# Patient Record
Sex: Female | Born: 1970 | Race: White | Hispanic: No | Marital: Single | Smoking: Never smoker
Health system: Southern US, Community
[De-identification: ages and names within clinical notes are randomized; demographics above are authoritative.]

## PROBLEM LIST (undated history)

## (undated) DIAGNOSIS — F329 Major depressive disorder, single episode, unspecified: Secondary | ICD-10-CM

## (undated) DIAGNOSIS — F32A Depression, unspecified: Secondary | ICD-10-CM

## (undated) DIAGNOSIS — F419 Anxiety disorder, unspecified: Secondary | ICD-10-CM

## (undated) HISTORY — PX: TUBAL LIGATION: SHX77

---

## 1997-10-07 ENCOUNTER — Ambulatory Visit (HOSPITAL_BASED_OUTPATIENT_CLINIC_OR_DEPARTMENT_OTHER): Admission: RE | Admit: 1997-10-07 | Discharge: 1997-10-07 | Payer: Self-pay | Admitting: Plastic Surgery

## 1998-12-10 ENCOUNTER — Other Ambulatory Visit: Admission: RE | Admit: 1998-12-10 | Discharge: 1998-12-10 | Payer: Self-pay | Admitting: *Deleted

## 2000-01-19 ENCOUNTER — Other Ambulatory Visit: Admission: RE | Admit: 2000-01-19 | Discharge: 2000-01-19 | Payer: Self-pay | Admitting: *Deleted

## 2001-02-14 ENCOUNTER — Other Ambulatory Visit: Admission: RE | Admit: 2001-02-14 | Discharge: 2001-02-14 | Payer: Self-pay | Admitting: *Deleted

## 2002-04-03 ENCOUNTER — Other Ambulatory Visit: Admission: RE | Admit: 2002-04-03 | Discharge: 2002-04-03 | Payer: Self-pay | Admitting: Obstetrics and Gynecology

## 2003-04-17 ENCOUNTER — Other Ambulatory Visit: Admission: RE | Admit: 2003-04-17 | Discharge: 2003-04-17 | Payer: Self-pay | Admitting: *Deleted

## 2003-04-17 ENCOUNTER — Other Ambulatory Visit: Admission: RE | Admit: 2003-04-17 | Discharge: 2003-04-17 | Payer: Self-pay | Admitting: Obstetrics and Gynecology

## 2004-05-06 ENCOUNTER — Other Ambulatory Visit: Admission: RE | Admit: 2004-05-06 | Discharge: 2004-05-06 | Payer: Self-pay | Admitting: Obstetrics and Gynecology

## 2005-04-26 ENCOUNTER — Inpatient Hospital Stay (HOSPITAL_COMMUNITY): Admission: AD | Admit: 2005-04-26 | Discharge: 2005-04-26 | Payer: Self-pay | Admitting: *Deleted

## 2005-05-27 ENCOUNTER — Other Ambulatory Visit: Admission: RE | Admit: 2005-05-27 | Discharge: 2005-05-27 | Payer: Self-pay | Admitting: Obstetrics and Gynecology

## 2005-05-27 ENCOUNTER — Ambulatory Visit (HOSPITAL_COMMUNITY): Admission: RE | Admit: 2005-05-27 | Discharge: 2005-05-27 | Payer: Self-pay | Admitting: Obstetrics and Gynecology

## 2006-02-09 ENCOUNTER — Inpatient Hospital Stay (HOSPITAL_COMMUNITY): Admission: AD | Admit: 2006-02-09 | Discharge: 2006-02-09 | Payer: Self-pay | Admitting: Obstetrics and Gynecology

## 2006-09-22 ENCOUNTER — Inpatient Hospital Stay (HOSPITAL_COMMUNITY): Admission: RE | Admit: 2006-09-22 | Discharge: 2006-09-25 | Payer: Self-pay | Admitting: Obstetrics and Gynecology

## 2011-02-15 LAB — HIV ANTIBODY (ROUTINE TESTING W REFLEX): HIV: NONREACTIVE

## 2011-02-15 LAB — HEPATITIS B SURFACE ANTIGEN: Hepatitis B Surface Ag: NEGATIVE

## 2011-02-15 LAB — RPR: RPR: NONREACTIVE

## 2011-02-15 LAB — RUBELLA ANTIBODY, IGM: Rubella: IMMUNE

## 2011-02-15 LAB — ANTIBODY SCREEN: Antibody Screen: NEGATIVE

## 2011-02-15 LAB — ABO/RH

## 2011-08-17 MED ORDER — FENTANYL CITRATE 0.05 MG/ML IJ SOLN
INTRAMUSCULAR | Status: AC
  Filled 2011-08-17: qty 5

## 2011-08-17 MED ORDER — MIDAZOLAM HCL 2 MG/2ML IJ SOLN
INTRAMUSCULAR | Status: AC
  Filled 2011-08-17: qty 2

## 2011-08-29 ENCOUNTER — Encounter (HOSPITAL_COMMUNITY): Payer: Self-pay | Admitting: Pharmacist

## 2011-09-05 ENCOUNTER — Encounter (HOSPITAL_COMMUNITY): Payer: Self-pay

## 2011-09-06 ENCOUNTER — Encounter (HOSPITAL_COMMUNITY)
Admission: RE | Admit: 2011-09-06 | Discharge: 2011-09-06 | Disposition: A | Discharge status: Home / Self Care | Payer: 59 | Source: Ambulatory Visit | Attending: Obstetrics and Gynecology | Admitting: Obstetrics and Gynecology

## 2011-09-06 ENCOUNTER — Encounter (HOSPITAL_COMMUNITY): Payer: Self-pay

## 2011-09-06 HISTORY — DX: Major depressive disorder, single episode, unspecified: F32.9

## 2011-09-06 HISTORY — DX: Depression, unspecified: F32.A

## 2011-09-06 HISTORY — DX: Anxiety disorder, unspecified: F41.9

## 2011-09-06 LAB — CBC
HCT: 39 % (ref 36.0–46.0)
Hemoglobin: 13.4 g/dL (ref 12.0–15.0)
MCH: 30.9 pg (ref 26.0–34.0)
MCHC: 34.4 g/dL (ref 30.0–36.0)
MCV: 90.1 fL (ref 78.0–100.0)
Platelets: 191 10*3/uL (ref 150–400)
RBC: 4.33 MIL/uL (ref 3.87–5.11)
RDW: 13.2 % (ref 11.5–15.5)
WBC: 8.8 10*3/uL (ref 4.0–10.5)

## 2011-09-06 LAB — RPR: RPR Ser Ql: NONREACTIVE

## 2011-09-06 LAB — SURGICAL PCR SCREEN
MRSA, PCR: NEGATIVE
Staphylococcus aureus: POSITIVE — AB

## 2011-09-06 NOTE — Patient Instructions (Signed)
YOUR PROCEDURE IS SCHEDULED ON:09/16/11  ENTER THROUGH THE MAIN ENTRANCE OF [facility] AT:6am  USE DESK PHONE AND DIAL [id number] TO INFORM us OF YOUR ARRIVAL  CALL [phone] IF YOU HAVE ANY QUESTIONS OR PROBLEMS PRIOR TO YOUR ARRIVAL.  REMEMBER: DO NOT EAT OR DRINK AFTER MIDNIGHT :Thursday  SPECIAL INSTRUCTIONS:   YOU MAY BRUSH YOUR TEETH THE MORNING OF SURGERY   TAKE THESE MEDICINES THE DAY OF SURGERY WITH SIP OF WATER:none  DO NOT WEAR JEWELRY, EYE MAKEUP, LIPSTICK OR DARK FINGERNAIL POLISH DO NOT WEAR LOTIONS  DO NOT SHAVE FOR 48 HOURS PRIOR TO SURGERY  YOU WILL NOT BE ALLOWED TO DRIVE YOURSELF HOME.  NAME OF DRIVER:Conan

## 2011-09-15 MED ORDER — DEXTROSE 5 % IV SOLN
2.0000 g | INTRAVENOUS | Status: AC
  Administered 2011-09-16: 2 g via INTRAVENOUS
  Filled 2011-09-15: qty 2

## 2011-09-15 NOTE — H&P (Addendum)
41 yo G3P1 @ 30 weeks presents for repeat c-section.  Pregnancy uncomplicated.  Past History - See hollister Latex allergy  AF, VSS Gen - NAD CV - RRR Lungs - Clear b Abd - gravid, NT Ext - no clonus  A/P:  Repeat c-section.  Questions answered and informed consent. Plan of care discussed again with pt.  She and husband desire sterility.  Will perform BPS.  Risks and alternatives discussed.  Informed consent

## 2011-09-16 ENCOUNTER — Encounter (HOSPITAL_COMMUNITY): Payer: Self-pay | Admitting: *Deleted

## 2011-09-16 ENCOUNTER — Encounter (HOSPITAL_COMMUNITY): Payer: Self-pay | Admitting: Neonatology

## 2011-09-16 ENCOUNTER — Encounter (HOSPITAL_COMMUNITY): Payer: Self-pay | Admitting: Anesthesiology

## 2011-09-16 ENCOUNTER — Inpatient Hospital Stay (HOSPITAL_COMMUNITY)
Admission: RE | Admit: 2011-09-16 | Discharge: 2011-09-19 | DRG: 765 | Disposition: A | Discharge status: Home / Self Care | Payer: 59 | Source: Ambulatory Visit | Attending: Obstetrics and Gynecology | Admitting: Obstetrics and Gynecology

## 2011-09-16 ENCOUNTER — Encounter (HOSPITAL_COMMUNITY)
Admission: RE | Disposition: A | Discharge status: Home / Self Care | Payer: Self-pay | Source: Ambulatory Visit | Attending: Obstetrics and Gynecology

## 2011-09-16 ENCOUNTER — Inpatient Hospital Stay (HOSPITAL_COMMUNITY): Payer: 59 | Admitting: Anesthesiology

## 2011-09-16 DIAGNOSIS — L03319 Cellulitis of trunk, unspecified: Secondary | ICD-10-CM | POA: Diagnosis not present

## 2011-09-16 DIAGNOSIS — O909 Complication of the puerperium, unspecified: Secondary | ICD-10-CM | POA: Diagnosis not present

## 2011-09-16 DIAGNOSIS — Z01812 Encounter for preprocedural laboratory examination: Secondary | ICD-10-CM

## 2011-09-16 DIAGNOSIS — Z01818 Encounter for other preprocedural examination: Secondary | ICD-10-CM

## 2011-09-16 DIAGNOSIS — Z302 Encounter for sterilization: Secondary | ICD-10-CM

## 2011-09-16 DIAGNOSIS — O34219 Maternal care for unspecified type scar from previous cesarean delivery: Principal | ICD-10-CM | POA: Diagnosis present

## 2011-09-16 DIAGNOSIS — L02219 Cutaneous abscess of trunk, unspecified: Secondary | ICD-10-CM | POA: Diagnosis not present

## 2011-09-16 LAB — CBC
HCT: 36.3 % (ref 36.0–46.0)
Hemoglobin: 12.1 g/dL (ref 12.0–15.0)
MCHC: 33.3 g/dL (ref 30.0–36.0)
MCV: 89.6 fL (ref 78.0–100.0)

## 2011-09-16 SURGERY — Surgical Case
Anesthesia: Spinal | Site: Abdomen | Laterality: Bilateral | Wound class: Clean Contaminated

## 2011-09-16 MED ORDER — OXYCODONE-ACETAMINOPHEN 5-325 MG PO TABS
1.0000 | ORAL_TABLET | ORAL | Status: DC | PRN
  Administered 2011-09-16 (×2): 1 via ORAL
  Administered 2011-09-17 (×5): 2 via ORAL
  Administered 2011-09-17 – 2011-09-19 (×8): 1 via ORAL
  Administered 2011-09-19: 2 via ORAL
  Filled 2011-09-16: qty 2
  Filled 2011-09-16 (×2): qty 1
  Filled 2011-09-16: qty 2
  Filled 2011-09-16: qty 1
  Filled 2011-09-16: qty 2
  Filled 2011-09-16: qty 1
  Filled 2011-09-16 (×2): qty 2
  Filled 2011-09-16 (×7): qty 1
  Filled 2011-09-16: qty 2

## 2011-09-16 MED ORDER — SODIUM CHLORIDE 0.9 % IV SOLN
1.0000 ug/kg/h | INTRAVENOUS | Status: DC | PRN
  Filled 2011-09-16: qty 2.5

## 2011-09-16 MED ORDER — DIPHENHYDRAMINE HCL 50 MG/ML IJ SOLN: 12.5000 mg | INTRAMUSCULAR | Status: DC | PRN

## 2011-09-16 MED ORDER — HYDROMORPHONE HCL PF 1 MG/ML IJ SOLN
0.5000 mg | INTRAMUSCULAR | Status: AC
  Administered 2011-09-16 (×2): 0.5 mg via INTRAVENOUS
  Filled 2011-09-16 (×3): qty 1

## 2011-09-16 MED ORDER — MEDROXYPROGESTERONE ACETATE 150 MG/ML IM SUSP: 150.0000 mg | INTRAMUSCULAR | Status: DC | PRN

## 2011-09-16 MED ORDER — FENTANYL CITRATE 0.05 MG/ML IJ SOLN
INTRAMUSCULAR | Status: AC
  Filled 2011-09-16: qty 2

## 2011-09-16 MED ORDER — MENTHOL 3 MG MT LOZG: 1.0000 | LOZENGE | OROMUCOSAL | Status: DC | PRN

## 2011-09-16 MED ORDER — IBUPROFEN 600 MG PO TABS
600.0000 mg | ORAL_TABLET | Freq: Four times a day (QID) | ORAL | Status: DC
  Administered 2011-09-16 – 2011-09-19 (×11): 600 mg via ORAL
  Filled 2011-09-16 (×11): qty 1

## 2011-09-16 MED ORDER — IBUPROFEN 600 MG PO TABS: 600.0000 mg | ORAL_TABLET | Freq: Four times a day (QID) | ORAL | Status: DC | PRN

## 2011-09-16 MED ORDER — OXYTOCIN 20 UNITS IN LACTATED RINGERS INFUSION - SIMPLE: 125.0000 mL/h | INTRAVENOUS | Status: AC

## 2011-09-16 MED ORDER — KETOROLAC TROMETHAMINE 30 MG/ML IJ SOLN
30.0000 mg | Freq: Four times a day (QID) | INTRAMUSCULAR | Status: AC | PRN
  Administered 2011-09-16: 30 mg via INTRAMUSCULAR

## 2011-09-16 MED ORDER — ONDANSETRON HCL 4 MG/2ML IJ SOLN: 4.0000 mg | Freq: Three times a day (TID) | INTRAMUSCULAR | Status: DC | PRN

## 2011-09-16 MED ORDER — ONDANSETRON HCL 4 MG PO TABS: 4.0000 mg | ORAL_TABLET | ORAL | Status: DC | PRN

## 2011-09-16 MED ORDER — LACTATED RINGERS IV SOLN
INTRAVENOUS | Status: DC
  Administered 2011-09-16 (×4): via INTRAVENOUS

## 2011-09-16 MED ORDER — DIPHENHYDRAMINE HCL 25 MG PO CAPS: 25.0000 mg | ORAL_CAPSULE | Freq: Four times a day (QID) | ORAL | Status: DC | PRN

## 2011-09-16 MED ORDER — OXYTOCIN 20 UNITS IN LACTATED RINGERS INFUSION - SIMPLE
INTRAVENOUS | Status: DC | PRN
  Administered 2011-09-16 (×2): 20 [IU] via INTRAVENOUS

## 2011-09-16 MED ORDER — FENTANYL CITRATE 0.05 MG/ML IJ SOLN: 25.0000 ug | INTRAMUSCULAR | Status: DC | PRN

## 2011-09-16 MED ORDER — FENTANYL CITRATE 0.05 MG/ML IJ SOLN
INTRAMUSCULAR | Status: DC | PRN
  Administered 2011-09-16: 15 ug via INTRATHECAL

## 2011-09-16 MED ORDER — KETOROLAC TROMETHAMINE 30 MG/ML IJ SOLN
30.0000 mg | Freq: Four times a day (QID) | INTRAMUSCULAR | Status: AC | PRN
  Administered 2011-09-16: 30 mg via INTRAVENOUS
  Filled 2011-09-16: qty 1

## 2011-09-16 MED ORDER — DIPHENHYDRAMINE HCL 50 MG/ML IJ SOLN
25.0000 mg | INTRAMUSCULAR | Status: DC | PRN
Start: 2011-09-16 — End: 2011-09-19

## 2011-09-16 MED ORDER — MEPERIDINE HCL 25 MG/ML IJ SOLN: 6.2500 mg | INTRAMUSCULAR | Status: DC | PRN

## 2011-09-16 MED ORDER — DIBUCAINE 1 % RE OINT: 1.0000 "application " | TOPICAL_OINTMENT | RECTAL | Status: DC | PRN

## 2011-09-16 MED ORDER — NALBUPHINE HCL 10 MG/ML IJ SOLN
5.0000 mg | INTRAMUSCULAR | Status: DC | PRN
  Filled 2011-09-16: qty 1

## 2011-09-16 MED ORDER — MORPHINE SULFATE 0.5 MG/ML IJ SOLN
INTRAMUSCULAR | Status: AC
  Filled 2011-09-16: qty 10

## 2011-09-16 MED ORDER — ONDANSETRON HCL 4 MG/2ML IJ SOLN
INTRAMUSCULAR | Status: DC | PRN
  Administered 2011-09-16: 4 mg via INTRAVENOUS

## 2011-09-16 MED ORDER — WITCH HAZEL-GLYCERIN EX PADS: 1.0000 "application " | MEDICATED_PAD | CUTANEOUS | Status: DC | PRN

## 2011-09-16 MED ORDER — LANOLIN HYDROUS EX OINT: 1.0000 "application " | TOPICAL_OINTMENT | CUTANEOUS | Status: DC | PRN

## 2011-09-16 MED ORDER — SENNOSIDES-DOCUSATE SODIUM 8.6-50 MG PO TABS
2.0000 | ORAL_TABLET | Freq: Every day | ORAL | Status: DC
  Administered 2011-09-16 – 2011-09-18 (×3): 2 via ORAL

## 2011-09-16 MED ORDER — OXYTOCIN 10 UNIT/ML IJ SOLN
INTRAMUSCULAR | Status: AC
  Filled 2011-09-16: qty 2

## 2011-09-16 MED ORDER — PRENATAL MULTIVITAMIN CH
1.0000 | ORAL_TABLET | Freq: Every day | ORAL | Status: DC
  Administered 2011-09-17 – 2011-09-19 (×3): 1 via ORAL
  Filled 2011-09-16 (×3): qty 1

## 2011-09-16 MED ORDER — PHENYLEPHRINE HCL 10 MG/ML IJ SOLN
INTRAMUSCULAR | Status: DC | PRN
  Administered 2011-09-16: 40 ug via INTRAVENOUS

## 2011-09-16 MED ORDER — METOCLOPRAMIDE HCL 5 MG/ML IJ SOLN: 10.0000 mg | Freq: Three times a day (TID) | INTRAMUSCULAR | Status: DC | PRN

## 2011-09-16 MED ORDER — ONDANSETRON HCL 4 MG/2ML IJ SOLN
INTRAMUSCULAR | Status: AC
  Filled 2011-09-16: qty 2

## 2011-09-16 MED ORDER — SIMETHICONE 80 MG PO CHEW
80.0000 mg | CHEWABLE_TABLET | Freq: Three times a day (TID) | ORAL | Status: DC
  Administered 2011-09-16 – 2011-09-19 (×9): 80 mg via ORAL

## 2011-09-16 MED ORDER — DIPHENHYDRAMINE HCL 25 MG PO CAPS
25.0000 mg | ORAL_CAPSULE | ORAL | Status: DC | PRN
  Filled 2011-09-16: qty 1

## 2011-09-16 MED ORDER — SIMETHICONE 80 MG PO CHEW: 80.0000 mg | CHEWABLE_TABLET | ORAL | Status: DC | PRN

## 2011-09-16 MED ORDER — ONDANSETRON HCL 4 MG/2ML IJ SOLN: 4.0000 mg | INTRAMUSCULAR | Status: DC | PRN

## 2011-09-16 MED ORDER — NALOXONE HCL 0.4 MG/ML IJ SOLN: 0.4000 mg | INTRAMUSCULAR | Status: DC | PRN

## 2011-09-16 MED ORDER — PHENYLEPHRINE 40 MCG/ML (10ML) SYRINGE FOR IV PUSH (FOR BLOOD PRESSURE SUPPORT)
PREFILLED_SYRINGE | INTRAVENOUS | Status: AC
  Filled 2011-09-16: qty 5

## 2011-09-16 MED ORDER — DEXTROSE IN LACTATED RINGERS 5 % IV SOLN
INTRAVENOUS | Status: DC
  Administered 2011-09-16: 11:00:00 via INTRAVENOUS

## 2011-09-16 MED ORDER — BUPIVACAINE IN DEXTROSE 0.75-8.25 % IT SOLN
INTRATHECAL | Status: DC | PRN
  Administered 2011-09-16: 11.75 mg via INTRATHECAL

## 2011-09-16 MED ORDER — KETOROLAC TROMETHAMINE 30 MG/ML IJ SOLN
INTRAMUSCULAR | Status: AC
  Filled 2011-09-16: qty 1

## 2011-09-16 MED ORDER — SODIUM CHLORIDE 0.9 % IJ SOLN: 3.0000 mL | INTRAMUSCULAR | Status: DC | PRN

## 2011-09-16 MED ORDER — MEASLES, MUMPS & RUBELLA VAC ~~LOC~~ INJ: 0.5000 mL | INJECTION | Freq: Once | SUBCUTANEOUS | Status: DC

## 2011-09-16 MED ORDER — TETANUS-DIPHTH-ACELL PERTUSSIS 5-2.5-18.5 LF-MCG/0.5 IM SUSP
0.5000 mL | Freq: Once | INTRAMUSCULAR | Status: DC
  Filled 2011-09-16: qty 0.5

## 2011-09-16 MED ORDER — MORPHINE SULFATE (PF) 0.5 MG/ML IJ SOLN
INTRAMUSCULAR | Status: DC | PRN
  Administered 2011-09-16: .1 mg via INTRATHECAL

## 2011-09-16 MED ORDER — SCOPOLAMINE 1 MG/3DAYS TD PT72
1.0000 | MEDICATED_PATCH | Freq: Once | TRANSDERMAL | Status: DC
  Administered 2011-09-16: 1.5 mg via TRANSDERMAL

## 2011-09-16 MED ORDER — SCOPOLAMINE 1 MG/3DAYS TD PT72
MEDICATED_PATCH | TRANSDERMAL | Status: AC
  Administered 2011-09-16: 1.5 mg via TRANSDERMAL
  Filled 2011-09-16: qty 1

## 2011-09-16 SURGICAL SUPPLY — 26 items
CHLORAPREP W/TINT 26ML (MISCELLANEOUS) ×2 IMPLANT
CLOTH BEACON ORANGE TIMEOUT ST (SAFETY) ×2 IMPLANT
DERMABOND ADVANCED (GAUZE/BANDAGES/DRESSINGS) ×1
DERMABOND ADVANCED .7 DNX12 (GAUZE/BANDAGES/DRESSINGS) ×1 IMPLANT
ELECT REM PT RETURN 9FT ADLT (ELECTROSURGICAL) ×2
ELECTRODE REM PT RTRN 9FT ADLT (ELECTROSURGICAL) ×1 IMPLANT
EXTRACTOR VACUUM M CUP 4 TUBE (SUCTIONS) IMPLANT
GLOVE BIO SURGEON STRL SZ 6.5 (GLOVE) ×2 IMPLANT
GLOVE BIOGEL PI IND STRL 7.0 (GLOVE) ×2 IMPLANT
GLOVE BIOGEL PI INDICATOR 7.0 (GLOVE) ×2
GOWN PREVENTION PLUS LG XLONG (DISPOSABLE) ×6 IMPLANT
KIT ABG SYR 3ML LUER SLIP (SYRINGE) ×2 IMPLANT
NEEDLE HYPO 25X5/8 SAFETYGLIDE (NEEDLE) ×2 IMPLANT
NS IRRIG 1000ML POUR BTL (IV SOLUTION) ×2 IMPLANT
PACK C SECTION WH (CUSTOM PROCEDURE TRAY) ×2 IMPLANT
SLEEVE SCD COMPRESS KNEE MED (MISCELLANEOUS) IMPLANT
STAPLER VISISTAT 35W (STAPLE) IMPLANT
SUT CHROMIC 0 CT 802H (SUTURE) IMPLANT
SUT CHROMIC 0 CTX 36 (SUTURE) ×6 IMPLANT
SUT MNCRL AB 3-0 PS2 27 (SUTURE) IMPLANT
SUT MON AB-0 CT1 36 (SUTURE) ×2 IMPLANT
SUT PDS AB 0 CTX 60 (SUTURE) ×2 IMPLANT
SUT PLAIN 0 NONE (SUTURE) ×2 IMPLANT
TOWEL OR 17X24 6PK STRL BLUE (TOWEL DISPOSABLE) ×4 IMPLANT
TRAY FOLEY CATH 14FR (SET/KITS/TRAYS/PACK) ×2 IMPLANT
WATER STERILE IRR 1000ML POUR (IV SOLUTION) ×2 IMPLANT

## 2011-09-16 NOTE — Anesthesia Preprocedure Evaluation (Addendum)
Anesthesia Evaluation  Patient identified by MRN, date of birth, ID band Patient awake    Reviewed: Allergy & Precautions, H&P , NPO status , Patient's Chart, lab work & pertinent test results  Airway Mallampati: II TM Distance: >3 FB Neck ROM: Full    Dental No notable dental hx. (+) Teeth Intact   Pulmonary neg pulmonary ROS,  breath sounds clear to auscultation  Pulmonary exam normal       Cardiovascular negative cardio ROS  Rhythm:Regular Rate:Normal     Neuro/Psych PSYCHIATRIC DISORDERS Anxiety Depression negative neurological ROS     GI/Hepatic negative GI ROS, Neg liver ROS,   Endo/Other  negative endocrine ROS  Renal/GU negative Renal ROS  negative genitourinary   Musculoskeletal   Abdominal Normal abdominal exam  (+)   Peds  Hematology negative hematology ROS (+)   Anesthesia Other Findings   Reproductive/Obstetrics (+) Pregnancy                          Anesthesia Physical Anesthesia Plan  ASA: II  Anesthesia Plan: Spinal   Post-op Pain Management:    Induction:   Airway Management Planned:   Additional Equipment:   Intra-op Plan:   Post-operative Plan:   Informed Consent: I have reviewed the patients History and Physical, chart, labs and discussed the procedure including the risks, benefits and alternatives for the proposed anesthesia with the patient or authorized representative who has indicated his/her understanding and acceptance.     Plan Discussed with: Anesthesiologist, CRNA and Surgeon  Anesthesia Plan Comments:         Anesthesia Quick Evaluation

## 2011-09-16 NOTE — Anesthesia Procedure Notes (Signed)
Spinal  Patient location during procedure: OR Start time: 09/16/2011 7:35 AM Staffing Anesthesiologist: CASSIDY, AMY Performed by: anesthesiologist  Preanesthetic Checklist Completed: patient identified, site marked, surgical consent, pre-op evaluation, timeout performed, IV checked, risks and benefits discussed and monitors and equipment checked Spinal Block Patient position: sitting Prep: site prepped and draped and DuraPrep Patient monitoring: heart rate, cardiac monitor, continuous pulse ox and blood pressure Approach: midline Location: L3-4 Injection technique: single-shot Needle Needle type: Sprotte  Needle gauge: 24 G Needle length: 9 cm Assessment Sensory level: T4 Additional Notes Clear free flow CSF on first attempt.  No paresthesia.  Patient tolerated procedure well.  Jasmine December, MD

## 2011-09-16 NOTE — Op Note (Signed)
NAMESHELLY, Sherri Cruz               ACCOUNT NO.:  [id number]  MEDICAL RECORD NO.:  [id number]  LOCATION:  [facility]                          [facility]ACILITY:  [facility]  PHYSICIAN:  Zelphia Cairo, MD    DATE OF BIRTH:  December 11, 1970  DATE OF PROCEDURE:  09/16/2011 DATE OF DISCHARGE:                              OPERATIVE REPORT   PREOPERATIVE DIAGNOSES: 1. Prior cesarean section, desires repeat. 2. Desires permanent sterilization.  PROCEDURE: 1. Repeat low transverse cesarean delivery 2. Bilateral partial salpingectomy.  SURGEON:  Zelphia Cairo, MD  ANESTHESIA:  Spinal.  FINDINGS:  Vigorous female infant with Apgars of 8 and 9.  Normal pelvic anatomy.  COMPLICATIONS:  None.  URINE OUTPUT:  Clear.  CONDITION:  Stable to recovery room.  PROCEDURE:  The patient was taken to the operating room after informed consent was obtained.  She was placed in the supine position with a left tilt after spinal anesthesia was given.  She was prepped and draped in sterile fashion.  A Foley catheter was inserted sterilely.  Pfannenstiel skin incision was made with the scalpel and carried down to the underlying fascia.  The fascia was incised in the midline.  This was extended laterally using curved Mayo scissors.  Kocher clamps were used to grasp the superior and inferior portion of the fascia.  This was tented upwards and the underlying rectus muscles were dissected off using curved Mayo scissors.  Peritoneum was identified and entered sharply.  This was extended superiorly and inferiorly with good visualization of the bladder.  The bladder blade was inserted and the vesicouterine peritoneum was dissected off the lower uterine segment using sharp and blunt dissection.  Bladder blade was repositioned.  Uterine incision was made with a scalpel and extended bluntly using my fingers.  The membranes were ruptured for clear fluid and the fetus was delivered using fundal pressure.  The mouth and nose were  suctioned and the cord was clamped and cut.  The infant was taken to the awaiting pediatric staff.  The placenta was then manually removed from the uterus.  The uterus was cleared of all clots and debris using a dry lap sponge.  Uterine incision was reapproximated using double layer closure of 0 chromic in a running, locked fashion.  Once hemostasis was achieved, our attention was turned to the fallopian tubes.  Right fallopian tube was grasped with a Babcock clamp and a small window was placed in the mesosalpinx using the Bovie.  Plain gut was used to doubly tie a knuckle of the fallopian tube.  This was then excised using Metzenbaum scissors.  Good hemostasis was achieved and the procedure was repeated on the opposite fallopian tube.  Hemostasis was achieved bilaterally.  The uterine incision was reinspected and hemostatic.  The pelvis was then irrigated.  The peritoneum was closed with 0 Monocryl. The fascia was closed with looped 0 PDS and the skin was closed with 0 Vicryl.  Dermabond was placed over the skin incision.  Sponge lap, needle, and instrument counts were correct x2.  She was taken to the recovery room in stable condition.     Zelphia Cairo, MD     GA/MEDQ  D:  09/16/2011  T:  09/16/2011  Job:  [id number]

## 2011-09-16 NOTE — Anesthesia Postprocedure Evaluation (Signed)
Anesthesia Post Note  Patient: Sherri Cruz  Procedure(s) Performed: Procedure(s) (LRB): CESAREAN SECTION WITH BILATERAL TUBAL LIGATION (Bilateral)  Anesthesia type: Spinal  Patient location: PACU  Post pain: Pain level controlled  Post assessment: Post-op Vital signs reviewed  Last Vitals:  Filed Vitals:   09/16/11 1020  BP: 111/72  Pulse: 54  Temp: 36.5 C  Resp: 16    Post vital signs: Reviewed  Level of consciousness: awake  Complications: No apparent anesthesia complications

## 2011-09-16 NOTE — Consult Note (Signed)
Called to attend repeat C/S at term gestation with no risk factors reported. AT birth infant in vertex with nuchal cord x 4, manually reduced. With complete exposure infant had spontaneous cries and good tone. Given tactile stimulation with drying and bulb suction to naso/oropharynx yielding minimal fluid.  No dysmorphic features.   Shown to parents and care transferred to RN from [facility] and to assigned pediatrician.  Dagoberto Ligas MD [facility]

## 2011-09-16 NOTE — Addendum Note (Signed)
Addendum  created 09/16/11 1151 by Dana Allan, MD   Modules edited:Orders

## 2011-09-16 NOTE — Transfer of Care (Signed)
Immediate Anesthesia Transfer of Care Note  Patient: Sherri Cruz  Procedure(s) Performed: Procedure(s) (LRB): CESAREAN SECTION WITH BILATERAL TUBAL LIGATION (Bilateral)  Patient Location: PACU  Anesthesia Type: Spinal  Level of Consciousness: awake, alert  and oriented  Airway & Oxygen Therapy: Patient Spontanous Breathing  Post-op Assessment: Report given to PACU RN and Post -op Vital signs reviewed and stable  Post vital signs: Reviewed and stable  Complications: No apparent anesthesia complications

## 2011-09-17 LAB — CBC
Hemoglobin: 11 g/dL — ABNORMAL LOW (ref 12.0–15.0)
MCH: 30.3 pg (ref 26.0–34.0)
MCV: 91.2 fL (ref 78.0–100.0)
RBC: 3.63 MIL/uL — ABNORMAL LOW (ref 3.87–5.11)

## 2011-09-17 NOTE — Progress Notes (Signed)
Subjective: Postpartum Day 1: Cesarean Delivery Patient reports tolerating PO, + flatus and no problems voiding.    Objective: Vital signs in last 24 hours: Temp:  [97.2 F (36.2 C)-98.7 F (37.1 C)] 98 F (36.7 C) (04/13 0623) Pulse Rate:  [54-95] 75  (04/13 0623) Resp:  [15-18] 16  (04/13 0623) BP: (99-114)/(64-72) 99/64 mmHg (04/13 0623) SpO2:  [95 %-99 %] 96 % (04/13 1610)  Physical Exam:  General: alert, cooperative and no distress Lochia: appropriate Uterine Fundus: firm Incision: healing well DVT Evaluation: No evidence of DVT seen on physical exam.   Basename 09/17/11 0500 09/16/11 1114  HGB 11.0* 12.1  HCT 33.1* 36.3    Assessment/Plan: Status post Cesarean section. Doing well postoperatively.  Continue current care.  Joe Gee C 09/17/2011, 9:41 AM

## 2011-09-18 NOTE — Progress Notes (Signed)
Subjective: Postpartum Day 2: Cesarean Delivery Patient reports tolerating PO, + flatus, + BM and no problems voiding.    Objective: Vital signs in last 24 hours: Temp:  [97.8 F (36.6 C)-98.5 F (36.9 C)] 98.2 F (36.8 C) (04/14 0540) Pulse Rate:  [76-84] 84  (04/14 0540) Resp:  [18-20] 18  (04/14 0540) BP: (105-115)/(64-71) 105/70 mmHg (04/14 0540) SpO2:  [97 %] 97 % (04/13 1122)  Physical Exam:  General: alert, cooperative, appears stated age and no distress Lochia: appropriate Uterine Fundus: firm Incision: healing well DVT Evaluation: No evidence of DVT seen on physical exam.   Basename 09/17/11 0500 09/16/11 1114  HGB 11.0* 12.1  HCT 33.1* 36.3    Assessment/Plan: Status post Cesarean section. Doing well postoperatively.  Continue current care.  Lynn Sissel C 09/18/2011, 9:27 AM

## 2011-09-19 MED ORDER — IBUPROFEN 600 MG PO TABS: 600.0000 mg | ORAL_TABLET | Freq: Four times a day (QID) | ORAL | Status: AC | PRN

## 2011-09-19 MED ORDER — CEPHALEXIN 500 MG PO CAPS
500.0000 mg | ORAL_CAPSULE | Freq: Four times a day (QID) | ORAL | Status: DC
  Administered 2011-09-19 (×2): 500 mg via ORAL
  Filled 2011-09-19 (×6): qty 1

## 2011-09-19 MED ORDER — CEPHALEXIN 500 MG PO CAPS: 500.0000 mg | ORAL_CAPSULE | Freq: Four times a day (QID) | ORAL | Status: AC

## 2011-09-19 MED ORDER — OXYCODONE-ACETAMINOPHEN 5-325 MG PO TABS: 1.0000 | ORAL_TABLET | ORAL | Status: AC | PRN

## 2011-09-19 NOTE — Progress Notes (Signed)
Subjective: Postpartum Day 3: Cesarean Delivery Patient reports tolerating PO, + flatus and no problems voiding.    Objective: Vital signs in last 24 hours: Temp:  [97.1 F (36.2 C)-98.2 F (36.8 C)] 98.2 F (36.8 C) (04/15 0525) Pulse Rate:  [70-76] 70  (04/15 0525) Resp:  [18-20] 20  (04/15 0525) BP: (107-137)/(74-84) 107/74 mmHg (04/15 0525)  Physical Exam:  General: alert and cooperative Lochia: appropriate Uterine Fundus: firm Incision: erythema noted superior to incision DVT Evaluation: No evidence of DVT seen on physical exam.   Basename 09/17/11 0500 09/16/11 1114  HGB 11.0* 12.1  HCT 33.1* 36.3    Assessment/Plan: Status post Cesarean section. Postoperative course complicated by incisional cellulitis  Discharge home with standard precautions and return to clinic in 3-4 days.  Annaleigha Woo G 09/19/2011, 8:19 AM

## 2011-09-19 NOTE — Progress Notes (Signed)
Pt discharged before Sw could see.  Referral reason: PP depression.

## 2011-09-19 NOTE — Discharge Summary (Signed)
Obstetric Discharge Summary Reason for Admission: cesarean section and BTL Prenatal Procedures: ultrasound Intrapartum Procedures: cesarean: low cervical, transverse and tubal ligation Postpartum Procedures: none Complications-Operative and Postpartum: wound infection Hemoglobin  Date Value Range Status  09/17/2011 11.0* 12.0-15.0 (g/dL) Final     HCT  Date Value Range Status  09/17/2011 33.1* 36.0-46.0 (%) Final    Physical Exam:  General: alert and cooperative Lochia: appropriate Uterine Fundus: firm Incision: erythema noted superior to incision DVT Evaluation: No evidence of DVT seen on physical exam.  Discharge Diagnoses: Term Pregnancy-delivered  Discharge Information: Date: 09/19/2011 Activity: pelvic rest Diet: routine Medications: PNV, Ibuprofen, Percocet and keflex Condition: stable Instructions: refer to practice specific booklet Discharge to: home   Newborn Data: Live born female  Birth Weight: 7 lb 7.4 oz (3385 g) APGAR: 8, 9  Home with mother.  Karesa Maultsby G 09/19/2011, 8:43 AM

## 2011-09-27 ENCOUNTER — Encounter (HOSPITAL_BASED_OUTPATIENT_CLINIC_OR_DEPARTMENT_OTHER): Payer: Self-pay | Admitting: *Deleted

## 2011-09-27 ENCOUNTER — Emergency Department (HOSPITAL_BASED_OUTPATIENT_CLINIC_OR_DEPARTMENT_OTHER)
Admission: EM | Admit: 2011-09-27 | Discharge: 2011-09-28 | Disposition: A | Discharge status: Home / Self Care | Payer: 59 | Attending: Emergency Medicine | Admitting: Emergency Medicine

## 2011-09-27 DIAGNOSIS — R112 Nausea with vomiting, unspecified: Secondary | ICD-10-CM

## 2011-09-27 DIAGNOSIS — R509 Fever, unspecified: Secondary | ICD-10-CM | POA: Insufficient documentation

## 2011-09-27 DIAGNOSIS — R197 Diarrhea, unspecified: Secondary | ICD-10-CM | POA: Insufficient documentation

## 2011-09-27 LAB — URINALYSIS, ROUTINE W REFLEX MICROSCOPIC
Nitrite: NEGATIVE
Specific Gravity, Urine: 1.006 (ref 1.005–1.030)
Urobilinogen, UA: 0.2 mg/dL (ref 0.0–1.0)

## 2011-09-27 LAB — URINE MICROSCOPIC-ADD ON

## 2011-09-27 NOTE — ED Notes (Signed)
Pt reports sudden onset of fever/vomiting/diarrhea since this morning. Had a recent C-section and was dc'd from hospital on 4/15. States was placed on cephalexin and finished her Rx for possible cellulitis. Denied vomiting/diarrhea/fevers until today. Denies urinary symptoms. Incision is intact and no s/s of infection. Pt does have healing bruising above the incision site. Pt reports improvement in appearance.

## 2011-09-27 NOTE — ED Notes (Signed)
Pt c/o n/v/d x 2 days, recent c section 4/12

## 2011-09-28 LAB — BASIC METABOLIC PANEL
Calcium: 8.1 mg/dL — ABNORMAL LOW (ref 8.4–10.5)
Creatinine, Ser: 0.7 mg/dL (ref 0.50–1.10)
GFR calc non Af Amer: >90 mL/min (ref >90)
Sodium: 135 mEq/L (ref 135–145)

## 2011-09-28 LAB — CBC
MCH: 30.3 pg (ref 26.0–34.0)
MCHC: 34.4 g/dL (ref 30.0–36.0)
MCV: 88.1 fL (ref 78.0–100.0)
Platelets: 266 10*3/uL (ref 150–400)
RDW: 12.5 % (ref 11.5–15.5)
WBC: 8.1 10*3/uL (ref 4.0–10.5)

## 2011-09-28 LAB — DIFFERENTIAL
Basophils Absolute: 0 10*3/uL (ref 0.0–0.1)
Eosinophils Absolute: 0.3 10*3/uL (ref 0.0–0.7)
Eosinophils Relative: 3 % (ref 0–5)

## 2011-09-28 MED ORDER — PROMETHAZINE HCL 25 MG RE SUPP: 12.5000 mg | Freq: Four times a day (QID) | RECTAL | Status: AC | PRN

## 2011-09-28 MED ORDER — ONDANSETRON 8 MG PO TBDP
8.0000 mg | ORAL_TABLET | Freq: Once | ORAL | Status: AC
  Administered 2011-09-28: 8 mg via ORAL
  Filled 2011-09-28: qty 1

## 2011-09-28 NOTE — ED Notes (Signed)
Pt was able to tolerate gingerale. Denies nausea at this time.

## 2011-09-28 NOTE — ED Provider Notes (Signed)
History     CSN: [id number]  Arrival date & time 09/27/11  2315   First MD Initiated Contact with Patient 09/27/11 2344      Chief Complaint  Patient presents with  . Fever  . Emesis  . Diarrhea    (Consider location/radiation/quality/duration/timing/severity/associated sxs/prior treatment) Patient is a 41 y.o. female presenting with vomiting and diarrhea. The history is provided by the patient. No language interpreter was used.  Emesis  The current episode started yesterday. The problem occurs 5 to 10 times per day. The problem has not changed since onset.The emesis has an appearance of stomach contents. There has been no fever. Associated symptoms include diarrhea and URI. Pertinent negatives include no arthralgias, no chills, no cough, no fever, no myalgias and no sweats. Risk factors include ill contacts.  Diarrhea The primary symptoms include vomiting and diarrhea. Primary symptoms do not include fever, myalgias or arthralgias. The illness began yesterday. The onset was sudden. The problem has not changed since onset. The diarrhea began yesterday. The diarrhea is watery. Risk factors: sick contacts.  The illness does not include chills. Risk factors: none.    Past Medical History  Diagnosis Date  . Anxiety   . Depression     no meds    Past Surgical History  Procedure Date  . Cesarean section   . Tubal ligation     History reviewed. No pertinent family history.  History  Substance Use Topics  . Smoking status: Never Smoker   . Smokeless tobacco: Not on file  . Alcohol Use: No    OB History    Grav Para Term Preterm Abortions TAB SAB Ect Mult Living   3 2 2       2       Review of Systems  Constitutional: Negative for fever and chills.  Respiratory: Negative for cough.   Gastrointestinal: Positive for vomiting and diarrhea.  Musculoskeletal: Negative for myalgias and arthralgias.  All other systems reviewed and are negative.    Allergies   Latex  Home Medications   Current Outpatient Rx  Name Route Sig Dispense Refill  . DOCUSATE SODIUM 100 MG PO CAPS Oral Take 100-200 mg by mouth daily as needed. For constipation    . IBUPROFEN 600 MG PO TABS Oral Take 1 tablet (600 mg total) by mouth every 6 (six) hours as needed. 30 tablet 0  . OXYCODONE-ACETAMINOPHEN 5-325 MG PO TABS Oral Take 1-2 tablets by mouth every 3 (three) hours as needed (moderate - severe pain). 30 tablet 0  . PRENATAL MULTIVITAMIN CH Oral Take 1 tablet by mouth daily.    . CEPHALEXIN 500 MG PO CAPS Oral Take 1 capsule (500 mg total) by mouth every 6 (six) hours. 28 capsule 0    BP 125/82  Pulse 112  Temp(Src) 99.7 F (37.6 C) (Oral)  Resp 18  Ht 5\' 7"  (1.702 m)  Wt 192 lb (87.091 kg)  BMI 30.07 kg/m2  SpO2 100%  LMP 12/16/2010  Breastfeeding? Yes  Physical Exam  Constitutional: She is oriented to person, place, and time. She appears well-developed and well-nourished.  HENT:  Head: Normocephalic and atraumatic.  Mouth/Throat: Oropharynx is clear and moist.  Eyes: Conjunctivae are normal. Pupils are equal, round, and reactive to light.  Neck: Normal range of motion. Neck supple.  Cardiovascular: Normal rate and regular rhythm.   Pulmonary/Chest: Effort normal and breath sounds normal. She has no wheezes. She has no rales.  Abdominal: Soft. Bowel sounds are normal. She exhibits  no mass. There is no rebound and no guarding.       Incision CDI  Musculoskeletal: Normal range of motion. She exhibits no edema.  Neurological: She is alert and oriented to person, place, and time. She has normal reflexes.  Skin: Skin is warm and dry.  Psychiatric: She has a normal mood and affect.    ED Course  Procedures (including critical care time)  Labs Reviewed  URINALYSIS, ROUTINE W REFLEX MICROSCOPIC - Abnormal; Notable for the following:    Hgb urine dipstick MODERATE (*)    Leukocytes, UA TRACE (*)    All other components within normal limits  URINE  MICROSCOPIC-ADD ON - Abnormal; Notable for the following:    Squamous Epithelial / LPF FEW (*)    All other components within normal limits  CBC  DIFFERENTIAL  BASIC METABOLIC PANEL   No results found.   No diagnosis found.    MDM  Pump and dump breast milk until cleared by pediatrician.         Jasmine Awe, MD 09/28/11 [phone]

## 2011-09-28 NOTE — Discharge Instructions (Signed)
B.R.A.T. Diet Your doctor has recommended the B.R.A.T. diet for you or your child until the condition improves. This is often used to help control diarrhea and vomiting symptoms. If you or your child can tolerate clear liquids, you may have:  Bananas.   Rice.   Applesauce.   Toast (and other simple starches such as crackers, potatoes, noodles).  Be sure to avoid dairy products, meats, and fatty foods until symptoms are better. Fruit juices such as apple, grape, and prune juice can make diarrhea worse. Avoid these. Continue this diet for 2 days or as instructed by your caregiver. Document Released: 05/23/2005 Document Revised: 05/12/2011 Document Reviewed: 11/09/2006 [employer] Patient Information 2012 [employer]. 

## 2013-12-13 ENCOUNTER — Emergency Department (HOSPITAL_BASED_OUTPATIENT_CLINIC_OR_DEPARTMENT_OTHER): Payer: 59

## 2013-12-13 ENCOUNTER — Encounter (HOSPITAL_BASED_OUTPATIENT_CLINIC_OR_DEPARTMENT_OTHER): Payer: Self-pay | Admitting: Emergency Medicine

## 2013-12-13 ENCOUNTER — Emergency Department (HOSPITAL_BASED_OUTPATIENT_CLINIC_OR_DEPARTMENT_OTHER)
Admission: EM | Admit: 2013-12-13 | Discharge: 2013-12-13 | Disposition: A | Discharge status: Home / Self Care | Payer: 59 | Attending: Emergency Medicine | Admitting: Emergency Medicine

## 2013-12-13 DIAGNOSIS — S8000XA Contusion of unspecified knee, initial encounter: Secondary | ICD-10-CM | POA: Insufficient documentation

## 2013-12-13 DIAGNOSIS — S4980XA Other specified injuries of shoulder and upper arm, unspecified arm, initial encounter: Secondary | ICD-10-CM | POA: Insufficient documentation

## 2013-12-13 DIAGNOSIS — Y9389 Activity, other specified: Secondary | ICD-10-CM | POA: Diagnosis not present

## 2013-12-13 DIAGNOSIS — S8990XA Unspecified injury of unspecified lower leg, initial encounter: Secondary | ICD-10-CM | POA: Diagnosis present

## 2013-12-13 DIAGNOSIS — Z91040 Latex allergy status: Secondary | ICD-10-CM | POA: Insufficient documentation

## 2013-12-13 DIAGNOSIS — S139XXA Sprain of joints and ligaments of unspecified parts of neck, initial encounter: Secondary | ICD-10-CM | POA: Diagnosis not present

## 2013-12-13 DIAGNOSIS — Z8659 Personal history of other mental and behavioral disorders: Secondary | ICD-10-CM | POA: Insufficient documentation

## 2013-12-13 DIAGNOSIS — S6990XA Unspecified injury of unspecified wrist, hand and finger(s), initial encounter: Secondary | ICD-10-CM | POA: Insufficient documentation

## 2013-12-13 DIAGNOSIS — S46909A Unspecified injury of unspecified muscle, fascia and tendon at shoulder and upper arm level, unspecified arm, initial encounter: Secondary | ICD-10-CM | POA: Diagnosis not present

## 2013-12-13 DIAGNOSIS — Y92410 Unspecified street and highway as the place of occurrence of the external cause: Secondary | ICD-10-CM | POA: Insufficient documentation

## 2013-12-13 DIAGNOSIS — Z79899 Other long term (current) drug therapy: Secondary | ICD-10-CM | POA: Diagnosis not present

## 2013-12-13 DIAGNOSIS — S99929A Unspecified injury of unspecified foot, initial encounter: Secondary | ICD-10-CM | POA: Diagnosis present

## 2013-12-13 DIAGNOSIS — S8001XA Contusion of right knee, initial encounter: Secondary | ICD-10-CM

## 2013-12-13 DIAGNOSIS — S161XXA Strain of muscle, fascia and tendon at neck level, initial encounter: Secondary | ICD-10-CM

## 2013-12-13 MED ORDER — IBUPROFEN 600 MG PO TABS
600.0000 mg | ORAL_TABLET | Freq: Four times a day (QID) | ORAL | Status: AC | PRN
Start: 2013-12-13 — End: ?

## 2013-12-13 MED ORDER — KETOROLAC TROMETHAMINE 60 MG/2ML IM SOLN
60.0000 mg | Freq: Once | INTRAMUSCULAR | Status: AC
  Administered 2013-12-13: 60 mg via INTRAMUSCULAR
  Filled 2013-12-13: qty 2

## 2013-12-13 MED ORDER — HYDROCODONE-ACETAMINOPHEN 5-325 MG PO TABS: 1.0000 | ORAL_TABLET | Freq: Four times a day (QID) | ORAL | Status: DC | PRN

## 2013-12-13 MED ORDER — CYCLOBENZAPRINE HCL 10 MG PO TABS: 10.0000 mg | ORAL_TABLET | Freq: Three times a day (TID) | ORAL | Status: DC | PRN

## 2013-12-13 NOTE — Discharge Instructions (Signed)
Cervical Sprain °A cervical sprain is an injury in the neck in which the strong, fibrous tissues (ligaments) that connect your neck bones stretch or tear. Cervical sprains can range from mild to severe. Severe cervical sprains can cause the neck vertebrae to be unstable. This can lead to damage of the spinal cord and can result in serious nervous system problems. The amount of time it takes for a cervical sprain to get better depends on the cause and extent of the injury. Most cervical sprains heal in 1 to 3 weeks. °CAUSES  °Severe cervical sprains may be caused by:  °· Contact sport injuries (such as from football, rugby, wrestling, hockey, auto racing, gymnastics, diving, martial arts, or boxing).   °· Motor vehicle collisions.   °· Whiplash injuries. This is an injury from a sudden forward and backward whipping movement of the head and neck.  °· Falls.   °Mild cervical sprains may be caused by:  °· Being in an awkward position, such as while cradling a telephone between your ear and shoulder.   °· Sitting in a chair that does not offer proper support.   °· Working at a poorly designed computer station.   °· Looking up or down for long periods of time.   °SYMPTOMS  °· Pain, soreness, stiffness, or a burning sensation in the front, back, or sides of the neck. This discomfort may develop immediately after the injury or slowly, 24 hours or more after the injury.   °· Pain or tenderness directly in the middle of the back of the neck.   °· Shoulder or upper back pain.   °· Limited ability to move the neck.   °· Headache.   °· Dizziness.   °· Weakness, numbness, or tingling in the hands or arms.   °· Muscle spasms.   °· Difficulty swallowing or chewing.   °· Tenderness and swelling of the neck.   °DIAGNOSIS  °Most of the time your health care provider can diagnose a cervical sprain by taking your history and doing a physical exam. Your health care provider will ask about previous neck injuries and any known neck  problems, such as arthritis in the neck. X-rays may be taken to find out if there are any other problems, such as with the bones of the neck. Other tests, such as a CT scan or MRI, may also be needed.  °TREATMENT  °Treatment depends on the severity of the cervical sprain. Mild sprains can be treated with rest, keeping the neck in place (immobilization), and pain medicines. Severe cervical sprains are immediately immobilized. Further treatment is done to help with pain, muscle spasms, and other symptoms and may include: °· Medicines, such as pain relievers, numbing medicines, or muscle relaxants.   °· Physical therapy. This may involve stretching exercises, strengthening exercises, and posture training. Exercises and improved posture can help stabilize the neck, strengthen muscles, and help stop symptoms from returning.   °HOME CARE INSTRUCTIONS  °· Put ice on the injured area.   °¨ Put ice in a plastic bag.   °¨ Place a towel between your skin and the bag.   °¨ Leave the ice on for 15-20 minutes, 3-4 times a day.   °· If your injury was severe, you may have been given a cervical collar to wear. A cervical collar is a two-piece collar designed to keep your neck from moving while it heals. °¨ Do not remove the collar unless instructed by your health care provider. °¨ If you have long hair, keep it outside of the collar. °¨ Ask your health care provider before making any adjustments to your collar. Minor   adjustments may be required over time to improve comfort and reduce pressure on your chin or on the back of your head.  Ifyou are allowed to remove the collar for cleaning or bathing, follow your health care provider's instructions on how to do so safely.  Keep your collar clean by wiping it with mild soap and water and drying it completely. If the collar you have been given includes removable pads, remove them every 1-2 days and hand wash them with soap and water. Allow them to air dry. They should be completely  dry before you wear them in the collar.  If you are allowed to remove the collar for cleaning and bathing, wash and dry the skin of your neck. Check your skin for irritation or sores. If you see any, tell your health care provider.  Do not drive while wearing the collar.   Only take over-the-counter or prescription medicines for pain, discomfort, or fever as directed by your health care provider.   Keep all follow-up appointments as directed by your health care provider.   Keep all physical therapy appointments as directed by your health care provider.   Make any needed adjustments to your workstation to promote good posture.   Avoid positions and activities that make your symptoms worse.   Warm up and stretch before being active to help prevent problems.  SEEK MEDICAL CARE IF:   Your pain is not controlled with medicine.   You are unable to decrease your pain medicine over time as planned.   Your activity level is not improving as expected.  SEEK IMMEDIATE MEDICAL CARE IF:   You develop any bleeding.  You develop stomach upset.  You have signs of an allergic reaction to your medicine.   Your symptoms get worse.   You develop new, unexplained symptoms.   You have numbness, tingling, weakness, or paralysis in any part of your body.  MAKE SURE YOU:   Understand these instructions.  Will watch your condition.  Will get help right away if you are not doing well or get worse. Document Released: 03/20/2007 Document Revised: 05/28/2013 Document Reviewed: 11/28/2012 [facility] Patient Information 2015 [place], [place]. This information is not intended to replace advice given to you by your health care provider. Make sure you discuss any questions you have with your health care provider.  Contusion A contusion is a deep bruise. Contusions happen when an injury causes bleeding under the skin. Signs of bruising include pain, puffiness (swelling), and discolored skin.  The contusion may turn blue, purple, or yellow. HOME CARE   Put ice on the injured area.  Put ice in a plastic bag.  Place a towel between your skin and the bag.  Leave the ice on for 15-20 minutes, 03-04 times a day.  Only take medicine as told by your doctor.  Rest the injured area.  If possible, raise (elevate) the injured area to lessen puffiness. GET HELP RIGHT AWAY IF:   You have more bruising or puffiness.  You have pain that is getting worse.  Your puffiness or pain is not helped by medicine. MAKE SURE YOU:   Understand these instructions.  Will watch your condition.  Will get help right away if you are not doing well or get worse. Document Released: 11/09/2007 Document Revised: 08/15/2011 Document Reviewed: 03/28/2011 [facility] Patient Information 2015 [place], [place]. This information is not intended to replace advice given to you by your health care provider. Make sure you discuss any questions you have with  your health care provider. Motor Vehicle Collision  It is common to have multiple bruises and sore muscles after a motor vehicle collision (MVC). These tend to feel worse for the first 24 hours. You may have the most stiffness and soreness over the first several hours. You may also feel worse when you wake up the first morning after your collision. After this point, you will usually begin to improve with each day. The speed of improvement often depends on the severity of the collision, the number of injuries, and the location and nature of these injuries. HOME CARE INSTRUCTIONS   Put ice on the injured area.  Put ice in a plastic bag.  Place a towel between your skin and the bag.  Leave the ice on for 15-20 minutes, 3-4 times a day, or as directed by your health care provider.  Drink enough fluids to keep your urine clear or pale yellow. Do not drink alcohol.  Take a warm shower or bath once or twice a day. This will increase blood flow to sore  muscles.  You may return to activities as directed by your caregiver. Be careful when lifting, as this may aggravate neck or back pain.  Only take over-the-counter or prescription medicines for pain, discomfort, or fever as directed by your caregiver. Do not use aspirin. This may increase bruising and bleeding. SEEK IMMEDIATE MEDICAL CARE IF:  You have numbness, tingling, or weakness in the arms or legs.  You develop severe headaches not relieved with medicine.  You have severe neck pain, especially tenderness in the middle of the back of your neck.  You have changes in bowel or bladder control.  There is increasing pain in any area of the body.  You have shortness of breath, lightheadedness, dizziness, or fainting.  You have chest pain.  You feel sick to your stomach (nauseous), throw up (vomit), or sweat.  You have increasing abdominal discomfort.  There is blood in your urine, stool, or vomit.  You have pain in your shoulder (shoulder strap areas).  You feel your symptoms are getting worse. MAKE SURE YOU:   Understand these instructions.  Will watch your condition.  Will get help right away if you are not doing well or get worse. Document Released: 05/23/2005 Document Revised: 05/28/2013 Document Reviewed: 10/20/2010 [facility] Patient Information 2015 [place], [place]. This information is not intended to replace advice given to you by your health care provider. Make sure you discuss any questions you have with your health care provider.
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
## 2013-12-13 NOTE — ED Notes (Signed)
Patient transported to X-ray 

## 2013-12-13 NOTE — ED Notes (Signed)
No adverse effects noted to IM injection.  
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
## 2014-01-08 ENCOUNTER — Other Ambulatory Visit: Payer: Self-pay | Admitting: Obstetrics and Gynecology

## 2014-01-10 LAB — CYTOLOGY - PAP

## 2014-01-31 ENCOUNTER — Ambulatory Visit: Payer: 59 | Attending: Family Medicine | Admitting: Physical Therapy

## 2014-01-31 DIAGNOSIS — M546 Pain in thoracic spine: Secondary | ICD-10-CM | POA: Diagnosis not present

## 2014-01-31 DIAGNOSIS — R5381 Other malaise: Secondary | ICD-10-CM | POA: Insufficient documentation

## 2014-01-31 DIAGNOSIS — M542 Cervicalgia: Secondary | ICD-10-CM | POA: Diagnosis not present

## 2014-01-31 DIAGNOSIS — Z91040 Latex allergy status: Secondary | ICD-10-CM | POA: Insufficient documentation

## 2014-01-31 DIAGNOSIS — IMO0001 Reserved for inherently not codable concepts without codable children: Secondary | ICD-10-CM | POA: Insufficient documentation

## 2014-02-05 ENCOUNTER — Ambulatory Visit: Payer: 59 | Attending: Family Medicine | Admitting: Physical Therapy

## 2014-02-05 ENCOUNTER — Ambulatory Visit: Payer: 59

## 2014-02-05 DIAGNOSIS — M542 Cervicalgia: Secondary | ICD-10-CM | POA: Insufficient documentation

## 2014-02-05 DIAGNOSIS — R5381 Other malaise: Secondary | ICD-10-CM | POA: Diagnosis not present

## 2014-02-05 DIAGNOSIS — M546 Pain in thoracic spine: Secondary | ICD-10-CM | POA: Insufficient documentation

## 2014-02-05 DIAGNOSIS — IMO0001 Reserved for inherently not codable concepts without codable children: Secondary | ICD-10-CM | POA: Insufficient documentation

## 2014-02-05 DIAGNOSIS — Z91040 Latex allergy status: Secondary | ICD-10-CM | POA: Insufficient documentation

## 2014-02-12 ENCOUNTER — Ambulatory Visit: Payer: 59 | Admitting: Physical Therapy

## 2014-02-12 DIAGNOSIS — IMO0001 Reserved for inherently not codable concepts without codable children: Secondary | ICD-10-CM | POA: Diagnosis not present

## 2014-02-13 ENCOUNTER — Ambulatory Visit: Payer: 59 | Admitting: Physical Therapy

## 2014-02-13 DIAGNOSIS — IMO0001 Reserved for inherently not codable concepts without codable children: Secondary | ICD-10-CM | POA: Diagnosis not present

## 2014-02-18 ENCOUNTER — Ambulatory Visit: Payer: 59 | Admitting: Physical Therapy

## 2014-02-18 DIAGNOSIS — IMO0001 Reserved for inherently not codable concepts without codable children: Secondary | ICD-10-CM | POA: Diagnosis not present

## 2014-02-19 ENCOUNTER — Ambulatory Visit: Payer: 59 | Admitting: Physical Therapy

## 2014-02-19 DIAGNOSIS — IMO0001 Reserved for inherently not codable concepts without codable children: Secondary | ICD-10-CM | POA: Diagnosis not present

## 2014-02-25 ENCOUNTER — Ambulatory Visit: Payer: 59 | Admitting: Physical Therapy

## 2014-02-25 DIAGNOSIS — IMO0001 Reserved for inherently not codable concepts without codable children: Secondary | ICD-10-CM | POA: Diagnosis not present

## 2014-02-27 ENCOUNTER — Ambulatory Visit: Payer: 59 | Admitting: Physical Therapy

## 2014-02-27 DIAGNOSIS — IMO0001 Reserved for inherently not codable concepts without codable children: Secondary | ICD-10-CM | POA: Diagnosis not present

## 2014-03-04 ENCOUNTER — Ambulatory Visit: Payer: 59 | Admitting: Physical Therapy

## 2014-03-04 DIAGNOSIS — IMO0001 Reserved for inherently not codable concepts without codable children: Secondary | ICD-10-CM | POA: Diagnosis not present

## 2014-03-06 ENCOUNTER — Ambulatory Visit: Payer: 59 | Attending: Family Medicine | Admitting: Physical Therapy

## 2014-03-06 DIAGNOSIS — M546 Pain in thoracic spine: Secondary | ICD-10-CM | POA: Diagnosis not present

## 2014-03-06 DIAGNOSIS — Z91040 Latex allergy status: Secondary | ICD-10-CM | POA: Insufficient documentation

## 2014-03-06 DIAGNOSIS — M542 Cervicalgia: Secondary | ICD-10-CM | POA: Diagnosis not present

## 2014-03-06 DIAGNOSIS — R5381 Other malaise: Secondary | ICD-10-CM | POA: Insufficient documentation

## 2014-03-06 DIAGNOSIS — Z5189 Encounter for other specified aftercare: Secondary | ICD-10-CM | POA: Diagnosis not present

## 2014-03-12 ENCOUNTER — Ambulatory Visit: Payer: 59 | Admitting: Physical Therapy

## 2014-03-12 DIAGNOSIS — Z5189 Encounter for other specified aftercare: Secondary | ICD-10-CM | POA: Diagnosis not present

## 2014-03-14 ENCOUNTER — Ambulatory Visit: Payer: 59 | Admitting: Physical Therapy

## 2014-03-14 DIAGNOSIS — Z5189 Encounter for other specified aftercare: Secondary | ICD-10-CM | POA: Diagnosis not present

## 2014-04-07 ENCOUNTER — Encounter (HOSPITAL_BASED_OUTPATIENT_CLINIC_OR_DEPARTMENT_OTHER): Payer: Self-pay | Admitting: Emergency Medicine

## 2014-11-11 ENCOUNTER — Encounter: Payer: Self-pay | Admitting: Diagnostic Neuroimaging

## 2014-11-11 ENCOUNTER — Ambulatory Visit (INDEPENDENT_AMBULATORY_CARE_PROVIDER_SITE_OTHER): Payer: 59 | Admitting: Diagnostic Neuroimaging

## 2014-11-11 VITALS — BP 102/72 | HR 65 | Ht 67.0 in | Wt 165.7 lb

## 2014-11-11 DIAGNOSIS — R208 Other disturbances of skin sensation: Secondary | ICD-10-CM | POA: Diagnosis not present

## 2014-11-11 DIAGNOSIS — R200 Anesthesia of skin: Secondary | ICD-10-CM
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
## 2014-11-11 NOTE — Patient Instructions (Signed)
Try bodyweight exercises (sworkit app), yoga, or gym classes.

## 2014-11-12 LAB — TSH: TSH: 1.58 u[IU]/mL (ref 0.450–4.500)

## 2014-11-12 LAB — HEMOGLOBIN A1C
ESTIMATED AVERAGE GLUCOSE: 103 mg/dL
HEMOGLOBIN A1C: 5.2 % (ref 4.8–5.6)

## 2014-11-12 LAB — VITAMIN B12: VITAMIN B 12: 560 pg/mL (ref 211–946)

## 2014-11-14 ENCOUNTER — Telehealth: Payer: Self-pay | Admitting: *Deleted

## 2014-11-14 NOTE — Telephone Encounter (Signed)
Called and left a message for the pt informing her that her labwork was normal. Asked her to call back with any questions

## 2014-11-17 ENCOUNTER — Telehealth: Payer: Self-pay | Admitting: *Deleted

## 2014-11-17 NOTE — Telephone Encounter (Signed)
Patient returned call and has some questions. Please call and advise.

## 2014-11-17 NOTE — Telephone Encounter (Signed)
Spoke to the pt on the phone, she asked me to inform her of the labs that she had done so that she could tell her PCP. She thanked me for the info and for calling

## 2015-02-18 ENCOUNTER — Ambulatory Visit: Payer: 59 | Admitting: Diagnostic Neuroimaging

## 2015-02-19 ENCOUNTER — Encounter: Payer: Self-pay | Admitting: Diagnostic Neuroimaging

## 2015-07-04 IMAGING — CR DG KNEE COMPLETE 4+V*R*
4 series · 4 of 4 positions shown · non-contrast
Comparison: None.

CLINICAL DATA: Patella knee pain

EXAM:
RIGHT KNEE - COMPLETE 4+ VIEW

[t knee ap right]
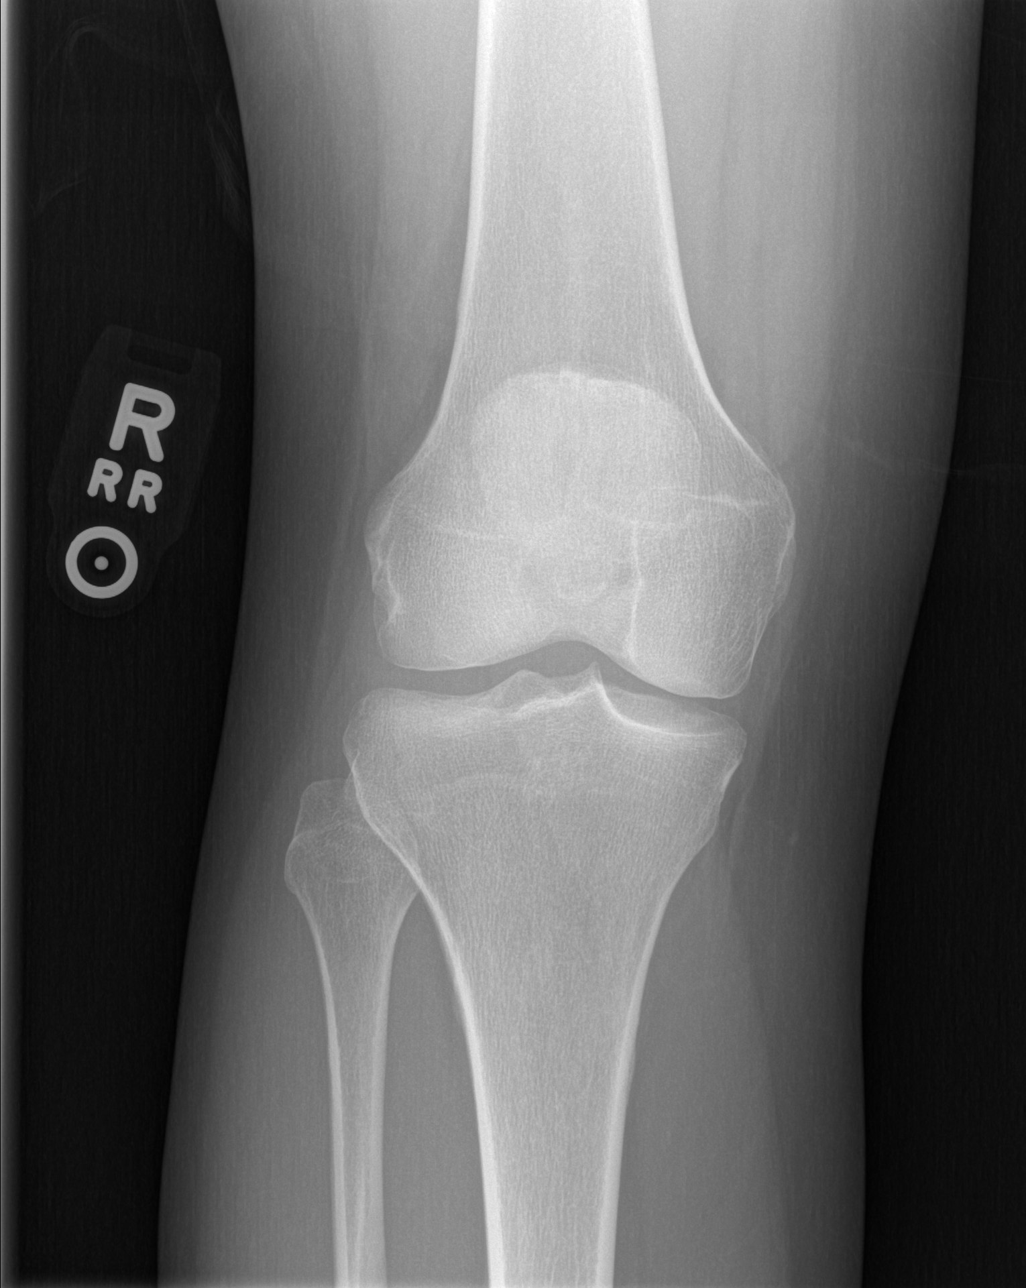

[t knee oblique right (1 of 2)]
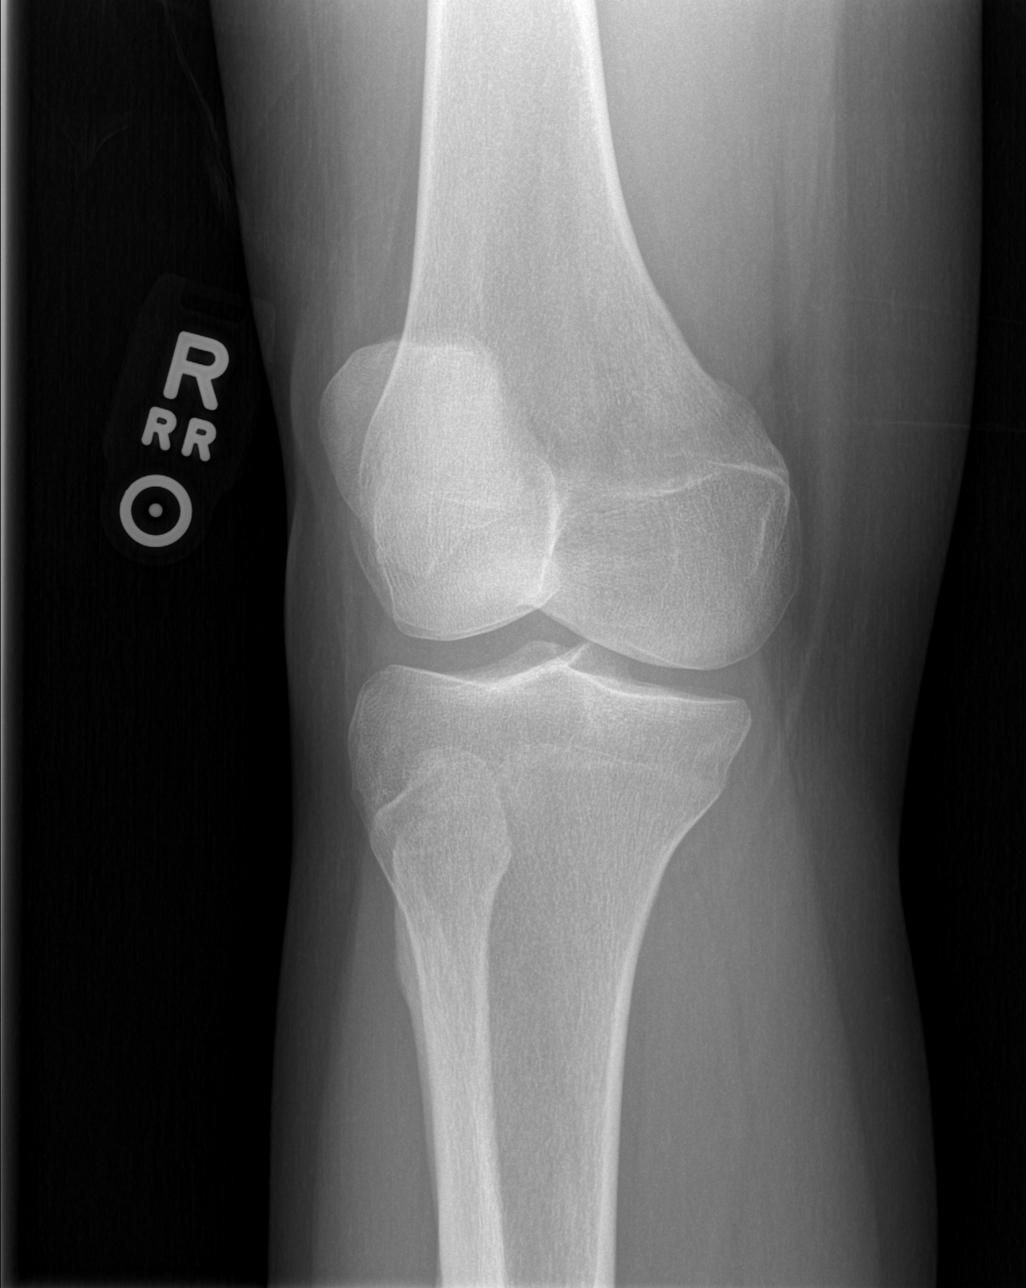

[t knee oblique right (2 of 2)]
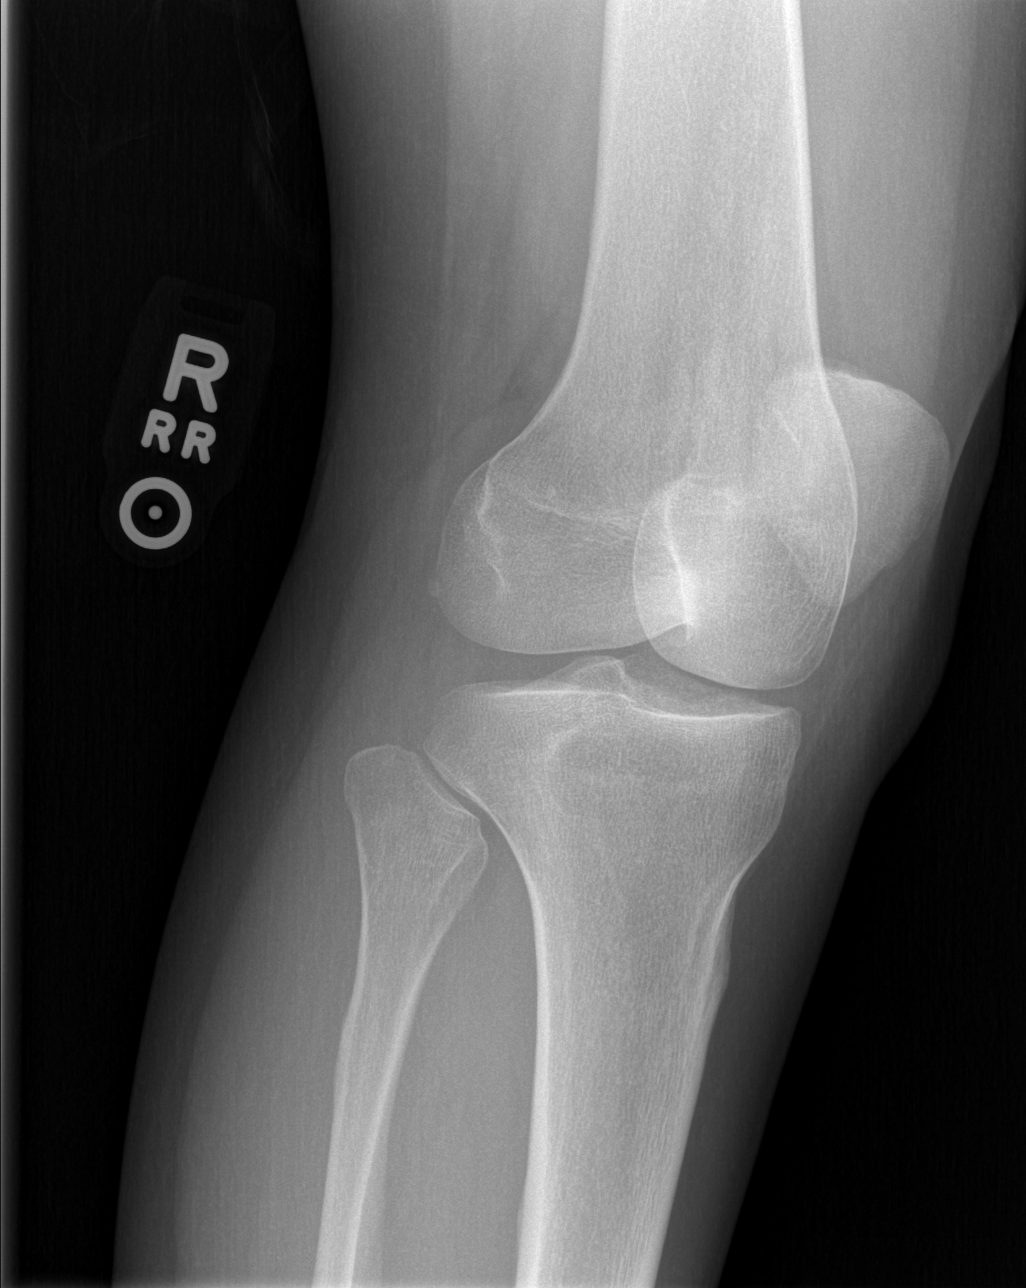

[t knee lat right]
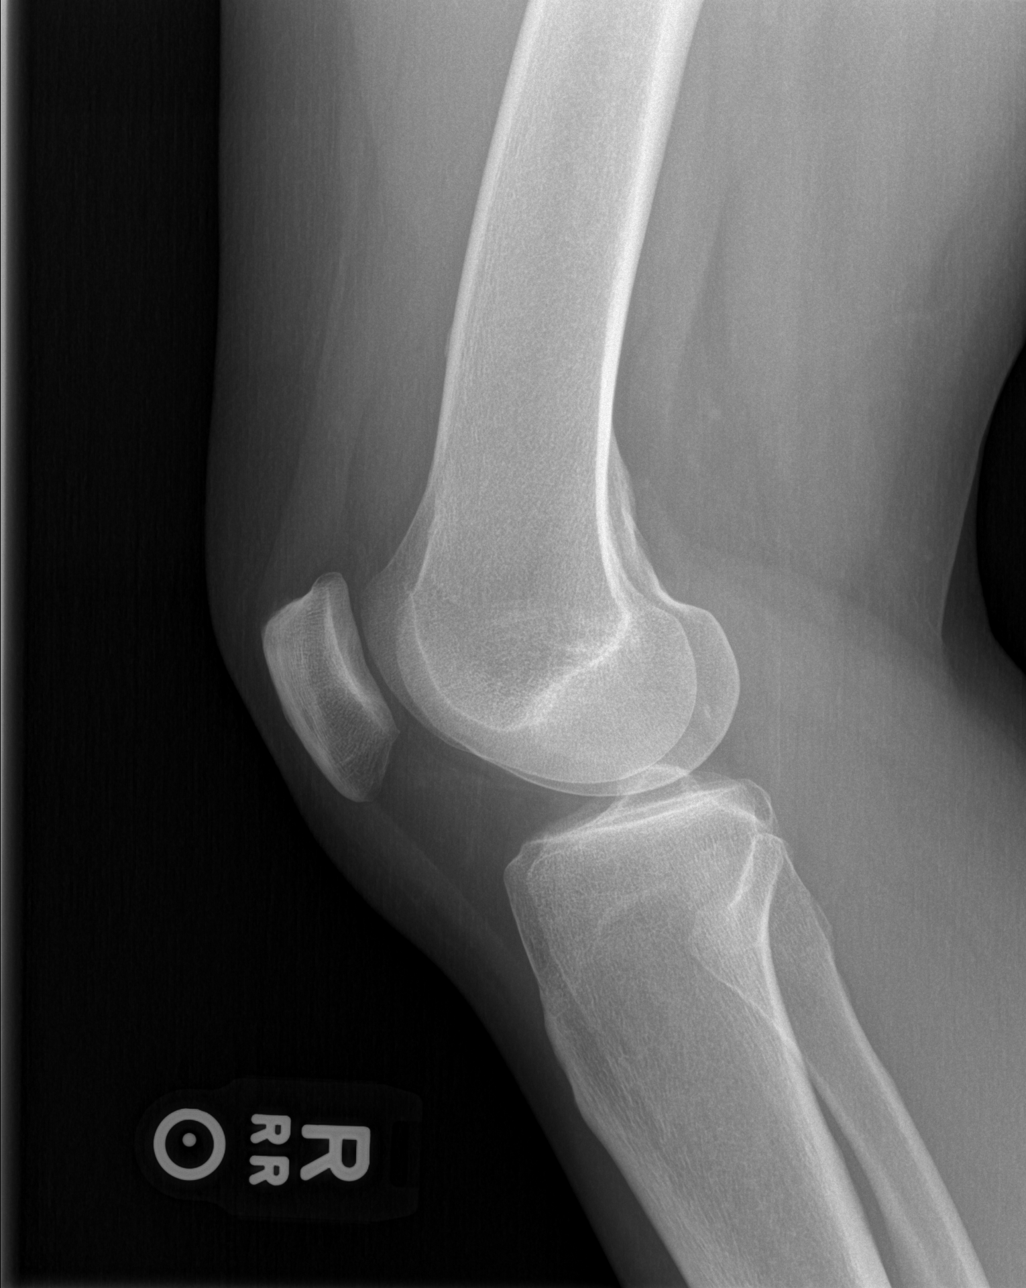

[4 of 4 positions shown; findings below may reference images not displayed]

FINDINGS: There is no evidence of fracture, dislocation, or joint effusion.
There is no evidence of arthropathy or other focal bone abnormality.
Soft tissues are unremarkable.
IMPRESSION: No acute osseous finding
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
## 2016-08-10 DIAGNOSIS — Z131 Encounter for screening for diabetes mellitus: Secondary | ICD-10-CM | POA: Diagnosis not present

## 2016-08-10 DIAGNOSIS — Z13220 Encounter for screening for lipoid disorders: Secondary | ICD-10-CM | POA: Diagnosis not present

## 2016-10-26 DIAGNOSIS — D2371 Other benign neoplasm of skin of right lower limb, including hip: Secondary | ICD-10-CM | POA: Diagnosis not present

## 2016-10-26 DIAGNOSIS — D485 Neoplasm of uncertain behavior of skin: Secondary | ICD-10-CM | POA: Diagnosis not present

## 2016-10-26 DIAGNOSIS — D225 Melanocytic nevi of trunk: Secondary | ICD-10-CM | POA: Diagnosis not present

## 2016-10-26 DIAGNOSIS — D2261 Melanocytic nevi of right upper limb, including shoulder: Secondary | ICD-10-CM | POA: Diagnosis not present

## 2017-03-01 DIAGNOSIS — H04123 Dry eye syndrome of bilateral lacrimal glands: Secondary | ICD-10-CM | POA: Diagnosis not present

## 2017-04-20 DIAGNOSIS — Z6823 Body mass index (BMI) 23.0-23.9, adult: Secondary | ICD-10-CM | POA: Diagnosis not present

## 2017-04-20 DIAGNOSIS — Z1231 Encounter for screening mammogram for malignant neoplasm of breast: Secondary | ICD-10-CM | POA: Diagnosis not present

## 2017-04-20 DIAGNOSIS — Z01419 Encounter for gynecological examination (general) (routine) without abnormal findings: Secondary | ICD-10-CM | POA: Diagnosis not present

## 2017-08-10 DIAGNOSIS — Z131 Encounter for screening for diabetes mellitus: Secondary | ICD-10-CM | POA: Diagnosis not present

## 2017-08-10 DIAGNOSIS — Z136 Encounter for screening for cardiovascular disorders: Secondary | ICD-10-CM | POA: Diagnosis not present

## 2017-10-05 DIAGNOSIS — N943 Premenstrual tension syndrome: Secondary | ICD-10-CM | POA: Diagnosis not present

## 2017-10-05 DIAGNOSIS — N920 Excessive and frequent menstruation with regular cycle: Secondary | ICD-10-CM | POA: Diagnosis not present

## 2017-10-05 DIAGNOSIS — N921 Excessive and frequent menstruation with irregular cycle: Secondary | ICD-10-CM | POA: Diagnosis not present

## 2017-10-05 DIAGNOSIS — K649 Unspecified hemorrhoids: Secondary | ICD-10-CM | POA: Diagnosis not present

## 2017-11-10 ENCOUNTER — Encounter: Payer: Self-pay | Admitting: Obstetrics and Gynecology

## 2018-03-29 DIAGNOSIS — L989 Disorder of the skin and subcutaneous tissue, unspecified: Secondary | ICD-10-CM | POA: Diagnosis not present

## 2018-03-29 DIAGNOSIS — L723 Sebaceous cyst: Secondary | ICD-10-CM | POA: Diagnosis not present

## 2019-05-09 ENCOUNTER — Other Ambulatory Visit: Payer: Self-pay

## 2019-05-09 DIAGNOSIS — Z20822 Contact with and (suspected) exposure to covid-19: Secondary | ICD-10-CM

## 2019-05-12 LAB — NOVEL CORONAVIRUS, NAA: SARS-CoV-2, NAA: NOT DETECTED

## 2019-08-22 DIAGNOSIS — Z0000 Encounter for general adult medical examination without abnormal findings: Secondary | ICD-10-CM | POA: Diagnosis not present

## 2019-08-22 DIAGNOSIS — F3341 Major depressive disorder, recurrent, in partial remission: Secondary | ICD-10-CM | POA: Diagnosis not present

## 2019-08-22 DIAGNOSIS — E7800 Pure hypercholesterolemia, unspecified: Secondary | ICD-10-CM | POA: Diagnosis not present

## 2019-08-22 DIAGNOSIS — R202 Paresthesia of skin: Secondary | ICD-10-CM | POA: Diagnosis not present

## 2020-01-28 DIAGNOSIS — Z20822 Contact with and (suspected) exposure to covid-19: Secondary | ICD-10-CM | POA: Diagnosis not present

## 2020-01-28 DIAGNOSIS — B342 Coronavirus infection, unspecified: Secondary | ICD-10-CM | POA: Diagnosis not present

## 2020-02-12 DIAGNOSIS — R21 Rash and other nonspecific skin eruption: Secondary | ICD-10-CM | POA: Diagnosis not present

## 2020-06-09 DIAGNOSIS — Z03818 Encounter for observation for suspected exposure to other biological agents ruled out: Secondary | ICD-10-CM | POA: Diagnosis not present

## 2020-06-15 DIAGNOSIS — J209 Acute bronchitis, unspecified: Secondary | ICD-10-CM | POA: Diagnosis not present

## 2020-06-15 DIAGNOSIS — Z20822 Contact with and (suspected) exposure to covid-19: Secondary | ICD-10-CM | POA: Diagnosis not present

## 2020-09-07 DIAGNOSIS — Z0000 Encounter for general adult medical examination without abnormal findings: Secondary | ICD-10-CM | POA: Diagnosis not present

## 2020-09-07 DIAGNOSIS — F3341 Major depressive disorder, recurrent, in partial remission: Secondary | ICD-10-CM | POA: Diagnosis not present

## 2020-09-07 DIAGNOSIS — E7800 Pure hypercholesterolemia, unspecified: Secondary | ICD-10-CM | POA: Diagnosis not present

## 2020-09-07 DIAGNOSIS — R5383 Other fatigue: Secondary | ICD-10-CM | POA: Diagnosis not present

## 2021-10-14 DIAGNOSIS — Z01419 Encounter for gynecological examination (general) (routine) without abnormal findings: Secondary | ICD-10-CM | POA: Diagnosis not present

## 2021-10-14 DIAGNOSIS — Z1321 Encounter for screening for nutritional disorder: Secondary | ICD-10-CM | POA: Diagnosis not present

## 2021-10-14 DIAGNOSIS — Z124 Encounter for screening for malignant neoplasm of cervix: Secondary | ICD-10-CM | POA: Diagnosis not present

## 2021-10-14 DIAGNOSIS — Z1231 Encounter for screening mammogram for malignant neoplasm of breast: Secondary | ICD-10-CM | POA: Diagnosis not present

## 2021-10-14 DIAGNOSIS — L989 Disorder of the skin and subcutaneous tissue, unspecified: Secondary | ICD-10-CM | POA: Diagnosis not present

## 2021-10-14 DIAGNOSIS — N959 Unspecified menopausal and perimenopausal disorder: Secondary | ICD-10-CM | POA: Diagnosis not present

## 2021-10-14 DIAGNOSIS — Z6827 Body mass index (BMI) 27.0-27.9, adult: Secondary | ICD-10-CM | POA: Diagnosis not present

## 2021-10-14 DIAGNOSIS — Z131 Encounter for screening for diabetes mellitus: Secondary | ICD-10-CM | POA: Diagnosis not present

## 2021-10-14 DIAGNOSIS — Z1329 Encounter for screening for other suspected endocrine disorder: Secondary | ICD-10-CM | POA: Diagnosis not present

## 2021-10-14 DIAGNOSIS — Z1151 Encounter for screening for human papillomavirus (HPV): Secondary | ICD-10-CM | POA: Diagnosis not present

## 2022-01-12 DIAGNOSIS — E559 Vitamin D deficiency, unspecified: Secondary | ICD-10-CM | POA: Diagnosis not present

## 2022-11-16 DIAGNOSIS — G4700 Insomnia, unspecified: Secondary | ICD-10-CM | POA: Diagnosis not present

## 2022-11-16 DIAGNOSIS — R5383 Other fatigue: Secondary | ICD-10-CM | POA: Diagnosis not present

## 2022-11-16 DIAGNOSIS — Z79890 Hormone replacement therapy (postmenopausal): Secondary | ICD-10-CM | POA: Diagnosis not present

## 2023-01-03 DIAGNOSIS — N951 Menopausal and female climacteric states: Secondary | ICD-10-CM | POA: Diagnosis not present

## 2023-02-15 DIAGNOSIS — Z01419 Encounter for gynecological examination (general) (routine) without abnormal findings: Secondary | ICD-10-CM | POA: Diagnosis not present

## 2023-02-15 DIAGNOSIS — Z6827 Body mass index (BMI) 27.0-27.9, adult: Secondary | ICD-10-CM | POA: Diagnosis not present

## 2023-02-15 DIAGNOSIS — Z124 Encounter for screening for malignant neoplasm of cervix: Secondary | ICD-10-CM | POA: Diagnosis not present

## 2023-02-15 DIAGNOSIS — Z1231 Encounter for screening mammogram for malignant neoplasm of breast: Secondary | ICD-10-CM | POA: Diagnosis not present

## 2023-02-15 DIAGNOSIS — Z1151 Encounter for screening for human papillomavirus (HPV): Secondary | ICD-10-CM | POA: Diagnosis not present
